# Patient Record
Sex: Male | Born: 2000 | Hispanic: Yes | Marital: Single | Smoking: Never smoker
Health system: Southern US, Community
[De-identification: ages and names within clinical notes are randomized; demographics above are authoritative.]

---
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
## 2020-04-25 ENCOUNTER — Other Ambulatory Visit: Payer: Self-pay

## 2020-04-25 ENCOUNTER — Encounter (HOSPITAL_COMMUNITY): Payer: Self-pay

## 2020-04-25 ENCOUNTER — Ambulatory Visit (HOSPITAL_COMMUNITY)
Admission: EM | Admit: 2020-04-25 | Discharge: 2020-04-25 | Disposition: A | Discharge status: Home / Self Care | Payer: BC Managed Care – PPO | Attending: Emergency Medicine | Admitting: Emergency Medicine

## 2020-04-25 DIAGNOSIS — H6122 Impacted cerumen, left ear: Secondary | ICD-10-CM

## 2020-04-25 NOTE — ED Provider Notes (Signed)
[facility]    CSN: [id number] Arrival date & time: 04/25/20  1130      History   Chief Complaint Chief Complaint  Patient presents with  . Ear Fullness    HPI Reginald Kim is a 20 y.o. male.   Patient presents with left ear "clogged" x2 days.  He attempted treatment at home with peroxide.  He denies fever, sore throat, cough, shortness of breath, vomiting, diarrhea, or other symptoms.  He denies pertinent medical history.  The history is provided by the patient.    History reviewed. No pertinent past medical history.  There are no problems to display for this patient.   History reviewed. No pertinent surgical history.     Home Medications    Prior to Admission medications   Medication Sig Start Date End Date Taking? Authorizing Provider  amoxicillin (AMOXIL) 500 MG capsule Take 1 capsule (500 mg total) by mouth 2 (two) times daily. 01/14/16   Elvina Sidle, MD  cephALEXin (KEFLEX) 500 MG capsule Take 1 capsule (500 mg total) by mouth 2 (two) times daily. 05/20/17   Ward, Layla Maw, DO  phenazopyridine (PYRIDIUM) 95 MG tablet Take 1 tablet (95 mg total) by mouth 3 (three) times daily as needed for pain. 05/20/17   Ward, Layla Maw, DO    Family History History reviewed. No pertinent family history.  Social History Social History   Tobacco Use  . Smoking status: Never Smoker  . Smokeless tobacco: Never Used  Substance Use Topics  . Alcohol use: No  . Drug use: No     Allergies   Patient has no known allergies.   Review of Systems Review of Systems  Constitutional: Negative for chills and fever.  HENT: Negative for ear pain and sore throat.        Left ear clogged  Eyes: Negative for pain and visual disturbance.  Respiratory: Negative for cough and shortness of breath.   Cardiovascular: Negative for chest pain and palpitations.  Gastrointestinal: Negative for abdominal pain and vomiting.  Genitourinary: Negative for dysuria and hematuria.   Musculoskeletal: Negative for arthralgias and back pain.  Skin: Negative for color change and rash.  Neurological: Negative for seizures and syncope.  All other systems reviewed and are negative.    Physical Exam Triage Vital Signs ED Triage Vitals  Enc Vitals Group     BP 04/25/20 1207 (!) 126/59     Pulse Rate 04/25/20 1207 65     Resp 04/25/20 1207 16     Temp 04/25/20 1207 99.4 F (37.4 C)     Temp Source 04/25/20 1207 Oral     SpO2 04/25/20 1207 99 %     Weight --      Height --      Head Circumference --      Peak Flow --      Pain Score 04/25/20 1208 0     Pain Loc --      Pain Edu? --      Excl. in GC? --    No data found.  Updated Vital Signs BP (!) 126/59 (BP Location: Right Arm)   Pulse 65   Temp 99.4 F (37.4 C) (Oral)   Resp 16   SpO2 99%   Visual Acuity Right Eye Distance:   Left Eye Distance:   Bilateral Distance:    Right Eye Near:   Left Eye Near:    Bilateral Near:     Physical Exam Vitals and nursing note  reviewed.  Constitutional:      General: He is not in acute distress.    Appearance: He is well-developed and well-nourished.  HENT:     Head: Normocephalic and atraumatic.     Right Ear: Tympanic membrane and ear canal normal.     Left Ear: There is impacted cerumen.     Nose: Nose normal.     Mouth/Throat:     Mouth: Mucous membranes are moist.     Pharynx: Oropharynx is clear.  Eyes:     Conjunctiva/sclera: Conjunctivae normal.  Cardiovascular:     Rate and Rhythm: Normal rate and regular rhythm.     Heart sounds: Normal heart sounds.  Pulmonary:     Effort: Pulmonary effort is normal. No respiratory distress.     Breath sounds: Normal breath sounds.  Abdominal:     Palpations: Abdomen is soft.     Tenderness: There is no abdominal tenderness.  Musculoskeletal:        General: No edema.     Cervical back: Neck supple.  Skin:    General: Skin is warm and dry.  Neurological:     General: No focal deficit present.      Mental Status: He is alert and oriented to person, place, and time.     Gait: Gait normal.  Psychiatric:        Mood and Affect: Mood and affect and mood normal.        Behavior: Behavior normal.      UC Treatments / Results  Labs (all labs ordered are listed, but only abnormal results are displayed) Labs Reviewed - No data to display  EKG   Radiology No results found.  Procedures Procedures (including critical care time)  Medications Ordered in UC Medications - No data to display  Initial Impression / Assessment and Plan / UC Course  I have reviewed the triage vital signs and the nursing notes.  Pertinent labs & imaging results that were available during my care of the patient were reviewed by me and considered in my medical decision making (see chart for details).   Impacted cerumen of left ear.  Cerumen removed via irrigation.  Patient reports complete relief of his symptoms.  TM noted to be clear.  Instructed him to follow-up with his PCP as needed.  He agrees to plan of care.   Final Clinical Impressions(s) / UC Diagnoses   Final diagnoses:  Impacted cerumen of left ear     Discharge Instructions     Your earwax was removed via irrigation today.    Follow up with your primary care provider as needed.         ED Prescriptions    None     PDMP not reviewed this encounter.   Mickie Bail, NP 04/25/20 1304

## 2020-04-25 NOTE — Discharge Instructions (Addendum)
Your earwax was removed via irrigation today.    Follow up with your primary care provider as needed.

## 2020-04-25 NOTE — ED Triage Notes (Signed)
Pt present left ear clogged for two days. Pt denies any other symptoms.

## 2020-07-12 ENCOUNTER — Encounter (HOSPITAL_COMMUNITY): Payer: Self-pay

## 2020-07-12 ENCOUNTER — Other Ambulatory Visit: Payer: Self-pay

## 2020-07-12 ENCOUNTER — Ambulatory Visit (HOSPITAL_COMMUNITY)
Admission: EM | Admit: 2020-07-12 | Discharge: 2020-07-12 | Disposition: A | Discharge status: Home / Self Care | Payer: BC Managed Care – PPO | Attending: Physician Assistant | Admitting: Physician Assistant

## 2020-07-12 DIAGNOSIS — R112 Nausea with vomiting, unspecified: Secondary | ICD-10-CM | POA: Diagnosis not present

## 2020-07-12 DIAGNOSIS — R195 Other fecal abnormalities: Secondary | ICD-10-CM

## 2020-07-12 MED ORDER — ONDANSETRON 4 MG PO TBDP
4.0000 mg | ORAL_TABLET | Freq: Three times a day (TID) | ORAL | 0 refills | Status: AC | PRN
Start: 2020-07-12 — End: ?

## 2020-07-12 NOTE — ED Triage Notes (Signed)
Pt in with c/o headache that has resolved and vomiting that started last night  Also states he does not have an appetite

## 2020-07-12 NOTE — ED Provider Notes (Signed)
[facility]    CSN: [id number] Arrival date & time: 07/12/20  1107      History   Chief Complaint Chief Complaint  Patient presents with  . Headache  . Vomiting  . loss of appetite    HPI Reginald Kim is a 20 y.o. male.   20 year old male comes in for acute onset of nausea, vomiting last night. States had 1 episode of NBNB vomiting with 1 episode of loose stools. Today, has mild abdominal discomfort that is described as upset. States nausea has resolved. Has tolerated fluid intake since vomiting. Has not tried food. Had episode subjective fever with sweating prior to arrival, no antipyretics used. Denies URI symptoms.      History reviewed. No pertinent past medical history.  There are no problems to display for this patient.   History reviewed. No pertinent surgical history.     Home Medications    Prior to Admission medications   Medication Sig Start Date End Date Taking? Authorizing Provider  ondansetron (ZOFRAN ODT) 4 MG disintegrating tablet Take 1 tablet (4 mg total) by mouth every 8 (eight) hours as needed for nausea or vomiting. 07/12/20  Yes Belinda Fisher, PA-C    Family History Family History  Problem Relation Age of Onset  . Healthy Mother     Social History Social History   Tobacco Use  . Smoking status: Never Smoker  . Smokeless tobacco: Never Used  Substance Use Topics  . Alcohol use: No  . Drug use: No     Allergies   Patient has no known allergies.   Review of Systems Review of Systems  Reason unable to perform ROS: See HPI as above.     Physical Exam Triage Vital Signs ED Triage Vitals  Enc Vitals Group     BP 07/12/20 1216 128/61     Pulse Rate 07/12/20 1216 80     Resp 07/12/20 1216 18     Temp 07/12/20 1216 98.5 F (36.9 C)     Temp src --      SpO2 07/12/20 1216 98 %     Weight --      Height --      Head Circumference --      Peak Flow --      Pain Score 07/12/20 1215 0     Pain Loc --      Pain Edu? --       Excl. in GC? --    No data found.  Updated Vital Signs BP 128/61   Pulse 80   Temp 98.5 F (36.9 C)   Resp 18   SpO2 98%   Physical Exam Constitutional:      General: He is not in acute distress.    Appearance: Normal appearance. He is not ill-appearing, toxic-appearing or diaphoretic.  HENT:     Head: Normocephalic and atraumatic.  Cardiovascular:     Rate and Rhythm: Normal rate and regular rhythm.  Pulmonary:     Effort: Pulmonary effort is normal. No respiratory distress.     Comments: LCTAB Abdominal:     General: Bowel sounds are normal.     Palpations: Abdomen is soft.     Tenderness: There is no abdominal tenderness. There is no right CVA tenderness, left CVA tenderness, guarding or rebound.  Musculoskeletal:     Cervical back: Normal range of motion and neck supple.  Skin:    General: Skin is warm and dry.  Neurological:  Mental Status: He is alert and oriented to person, place, and time.      UC Treatments / Results  Labs (all labs ordered are listed, but only abnormal results are displayed) Labs Reviewed - No data to display  EKG   Radiology No results found.  Procedures Procedures (including critical care time)  Medications Ordered in UC Medications - No data to display  Initial Impression / Assessment and Plan / UC Course  I have reviewed the triage vital signs and the nursing notes.  Pertinent labs & imaging results that were available during my care of the patient were reviewed by me and considered in my medical decision making (see chart for details).    Discussed with patient no alarming signs on exam. Zofran for nausea. Push fluids. Bland diet, advance as tolerated. Return precautions given.  Final Clinical Impressions(s) / UC Diagnoses   Final diagnoses:  Intractable vomiting with nausea, unspecified vomiting type  Loose stools    ED Prescriptions    Medication Sig Dispense Auth. Provider   ondansetron (ZOFRAN ODT) 4 MG  disintegrating tablet Take 1 tablet (4 mg total) by mouth every 8 (eight) hours as needed for nausea or vomiting. 15 tablet Belinda Fisher, PA-C     PDMP not reviewed this encounter.   Belinda Fisher, PA-C 07/12/20 1320

## 2020-07-12 NOTE — Discharge Instructions (Signed)
Zofran for nausea and vomiting as needed. Keep hydrated, you urine should be clear to pale yellow in color. Bland diet, advance as tolerated. Monitor for any worsening of symptoms, nausea or vomiting not controlled by medication, worsening abdominal pain, fever, go to the emergency department for further evaluation needed.  ° °
